# Patient Record
Sex: Female | Born: 1975 | Race: Black or African American | Hispanic: No | Marital: Single | Smoking: Never smoker
Health system: Southern US, Community
[De-identification: ages and names within clinical notes are randomized; demographics above are authoritative.]

## PROBLEM LIST (undated history)

## (undated) DIAGNOSIS — R87619 Unspecified abnormal cytological findings in specimens from cervix uteri: Secondary | ICD-10-CM

## (undated) DIAGNOSIS — IMO0002 Reserved for concepts with insufficient information to code with codable children: Secondary | ICD-10-CM

## (undated) DIAGNOSIS — A609 Anogenital herpesviral infection, unspecified: Secondary | ICD-10-CM

## (undated) DIAGNOSIS — O09529 Supervision of elderly multigravida, unspecified trimester: Secondary | ICD-10-CM

## (undated) HISTORY — DX: Supervision of elderly multigravida, unspecified trimester: O09.529

## (undated) HISTORY — DX: Reserved for concepts with insufficient information to code with codable children: IMO0002

## (undated) HISTORY — PX: NO PAST SURGERIES: SHX2092

## (undated) HISTORY — DX: Unspecified abnormal cytological findings in specimens from cervix uteri: R87.619

## (undated) HISTORY — DX: Anogenital herpesviral infection, unspecified: A60.9

---

## 2006-09-19 ENCOUNTER — Inpatient Hospital Stay (HOSPITAL_COMMUNITY): Admission: AD | Admit: 2006-09-19 | Discharge: 2006-09-21 | Payer: Self-pay | Admitting: Obstetrics and Gynecology

## 2007-12-15 ENCOUNTER — Other Ambulatory Visit: Admission: RE | Admit: 2007-12-15 | Discharge: 2007-12-15 | Payer: Self-pay | Admitting: Obstetrics and Gynecology

## 2009-01-03 ENCOUNTER — Other Ambulatory Visit: Admission: RE | Admit: 2009-01-03 | Discharge: 2009-01-03 | Payer: Self-pay | Admitting: Obstetrics and Gynecology

## 2010-02-27 ENCOUNTER — Other Ambulatory Visit: Admission: RE | Admit: 2010-02-27 | Discharge: 2010-02-27 | Payer: Self-pay | Admitting: Obstetrics and Gynecology

## 2010-03-10 ENCOUNTER — Ambulatory Visit (HOSPITAL_COMMUNITY): Admission: RE | Admit: 2010-03-10 | Discharge: 2010-03-10 | Payer: Self-pay | Admitting: Obstetrics & Gynecology

## 2011-01-26 NOTE — H&P (Signed)
NAME:  Jacqueline Mcdaniel, Jacqueline Mcdaniel               ACCOUNT NO.:  [id number]   MEDICAL RECORD NO.:  [id number]          PATIENT TYPE:  INP   LOCATION:  LDR5                          FACILITY:  [facility]   PHYSICIAN:  Lazaro Arms, M.D.   DATE OF BIRTH:  1976-08-31   DATE OF ADMISSION:  09/19/2006  DATE OF DISCHARGE:  LH                              HISTORY & PHYSICAL   Jacqueline Mcdaniel is a 35 year old, African-American female, gravida 3, para 1,  abortus 1, estimated date of delivery of September 11, 2006, at 59 weeks'  gestation who is admitted for cervical ripening induction of labor.  Her  pregnancy has otherwise been uncomplicated.  Her cervix on September 13, 2006, was 2, thick, -2 vertex, and posterior.   PAST MEDICAL HISTORY:  Negative.   PAST SURGERIES:  Negative.   ALLERGIES:  None.   MEDICATIONS:  1. Valtrex for suppression.  2. Prenatal vitamins.  3. Iron.   Blood type is missing from her chart.  Her  hepatitis B is negative.  HIV is nonreactive.  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  GC and  Chlamydia negative x2.  Group-B strep was negative.  Glucola was normal.   REVIEW OF SYSTEMS:  Negative.   IMPRESSION:  1. Intrauterine pregnancy 41 weeks' gestation.  2. Unfavorable cervix.   PLAN:  The patient admitted for Foley bulb ripening followed by pitocin  induction of labor.  She understands the indications and we will  proceed.      Lazaro Arms, M.D.  Electronically Signed     LHE/MEDQ  D:  09/20/2006  T:  09/20/2006  Job:  [id number]

## 2011-01-26 NOTE — Group Therapy Note (Signed)
Jacqueline Mcdaniel, LANZO               ACCOUNT NO.:  [id number]   MEDICAL RECORD NO.:  [id number]          PATIENT TYPE:  INP   LOCATION:  A410                          FACILITY:  [facility]   PHYSICIAN:  Lazaro Arms, M.D.   DATE OF BIRTH:  12-Jul-1976   DATE OF PROCEDURE:  DATE OF DISCHARGE:                                 PROGRESS NOTE   DELIVERY NOTE.   After [name] received her epidural she developed some hypotension for  which the baby responded by having about 10 minutes of bradycardic  issues during contractions.  She responded very nicely to ephedrine and  the heart rate remained stable throughout.  She was examined at  approximately 1245 and noted to be fully dilated at +2 station; however,  she did not have an urge to push at that time.  At approximately 1305  she did develop an urge to push and so she started pushing and at 1322  had a spontaneous vaginal delivery of a viable female infant.  Weight is  7 pounds 4 ounces, Apgars are 9 and 9.  Twenty units of Pitocin dilated  in 1000 mL of lactated Ringer's was infused rapidly IV.  The placenta  separated spontaneously and delivered via controlled cord traction and  __________ 1327.  It was inspected and appears to be intact with a 3-  vessel cord.  Estimated blood loss was 250 mL.  The vagina was inspected  and no lacerations were found.  The epidural catheter was then removed  with the blue tip visualized as being intact.      [name], C.N.M.      Lazaro Arms, M.D.  Electronically Signed    FC/MEDQ  D:  09/20/2006  T:  09/20/2006  Job:  [id number]

## 2011-01-26 NOTE — Op Note (Signed)
Jacqueline Mcdaniel, Jacqueline Mcdaniel               ACCOUNT NO.:  [id number]   MEDICAL RECORD NO.:  [id number]          PATIENT TYPE:  INP   LOCATION:  LDR5                          FACILITY:  [facility]   PHYSICIAN:  Lazaro Arms, M.D.   DATE OF BIRTH:  02-Dec-1975   DATE OF PROCEDURE:  09/20/2006  DATE OF DISCHARGE:                               OPERATIVE REPORT   OPERATION/PROCEDURE:  Epidural placement.   TIME:  11:40 a.m.   INDICATIONS:  Jacqueline Mcdaniel is a 35 year old, gravida 3, para 1, abortus 1,  5  cm dilated, requesting an epidural to be  placed.   DESCRIPTION OF PROCEDURE:  She was placed in the sitting position,  Betadine prep was used and 1% lidocaine injected in the L3-4 interspace.  The area was still draped.  A 17-gauge Tuohy needle was used.  Loss-of-  resistance technique employed.  Required several sticks because of the  patient's positioning and obesity.  She loss-of-resistance employed and  found the epidural space and 10 mL 0.125% bupivacaine was given as a  test dose without ill effects.  The epidural catheter was then fed,  taken down 5 cm from the epidural space.  An additional 10 mL was given  through the catheter to dose up the epidural.  Continuous infusion  0.125% bupivacaine 2 mcg/mL of fentanyl __________ at 12 ml/hour.  The  patient tolerated it well and was getting good pain relief.  Blood  pressure was stable.  Fetal heart rate tracing stable.      Lazaro Arms, M.D.  Electronically Signed     LHE/MEDQ  D:  09/20/2006  T:  09/20/2006  Job:  [id number]

## 2011-10-25 ENCOUNTER — Other Ambulatory Visit: Payer: Self-pay | Admitting: Obstetrics & Gynecology

## 2012-02-16 IMAGING — US US SOFT TISSUE HEAD/NECK
1 series · 14 of 21 positions shown · non-contrast
Comparison: None.

CLINICAL DATA: Enlarged thyroid gland

THYROID ULTRASOUND
TECHNIQUE: Ultrasound examination of the thyroid gland and
adjacent soft tissues was performed.

[Series 1: us soft tissue head/neck · 0.08mm/px · 14 of 21 slices shown]
[im 1/21]
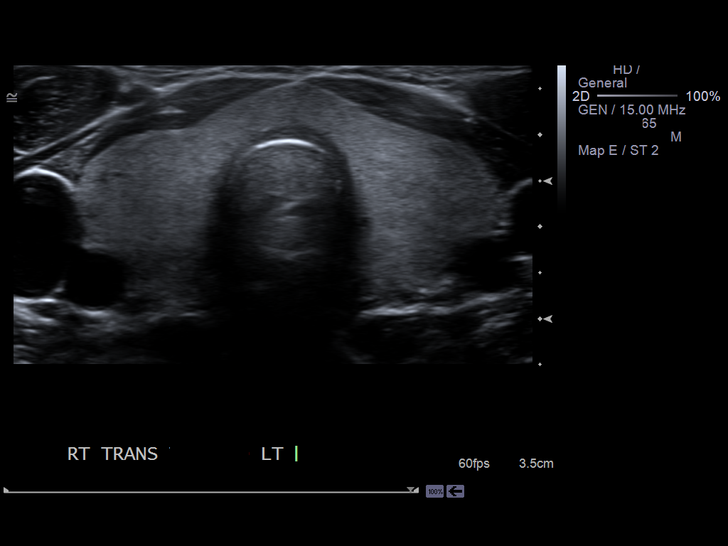
[im 3/21]
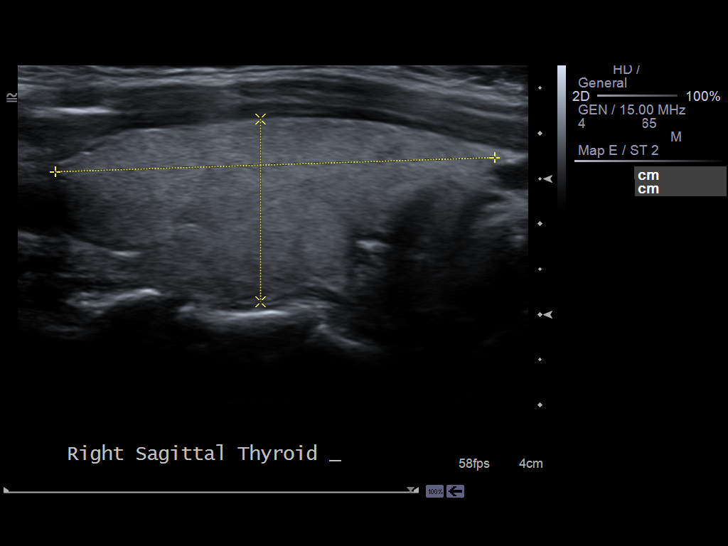
[im 4/21]
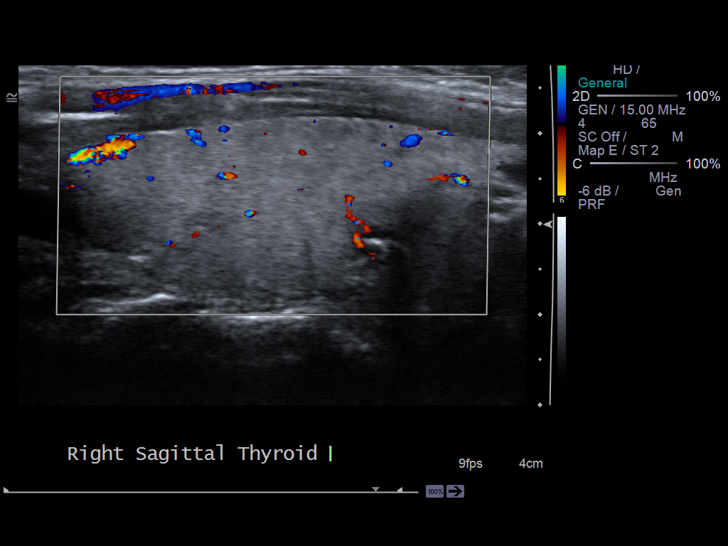
[im 6/21]
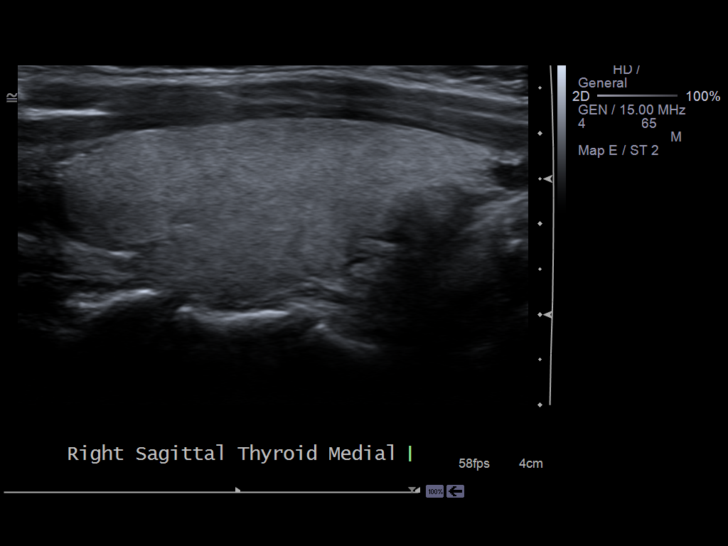
[im 7/21]
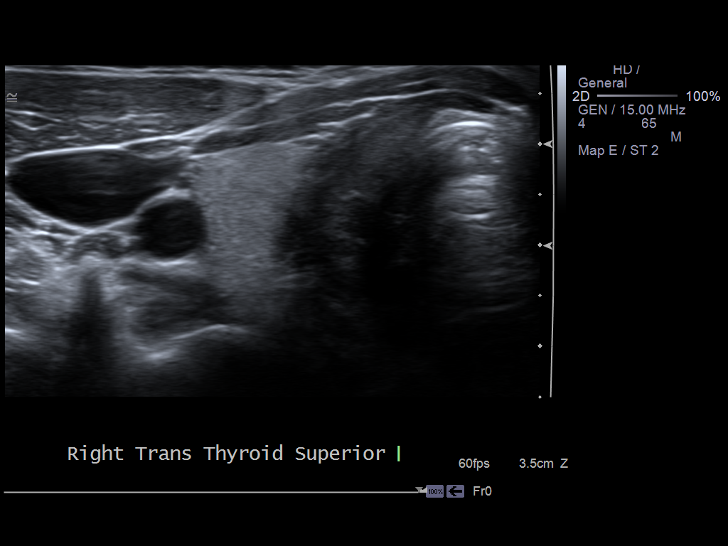
[im 9/21]
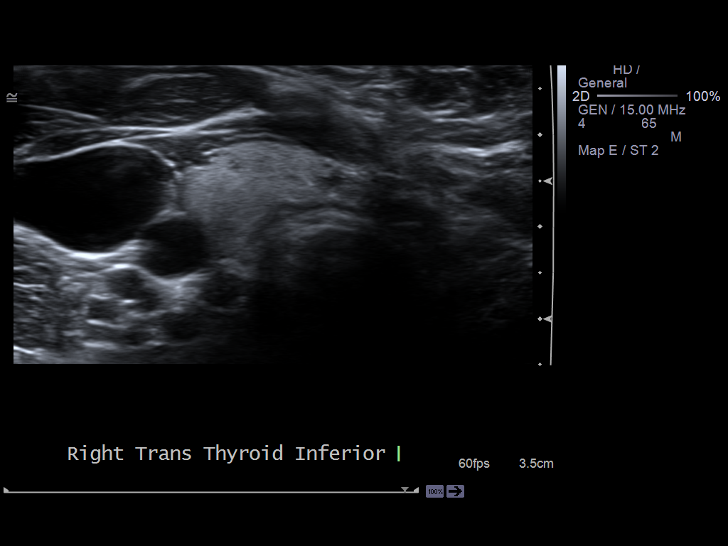
[im 10/21]
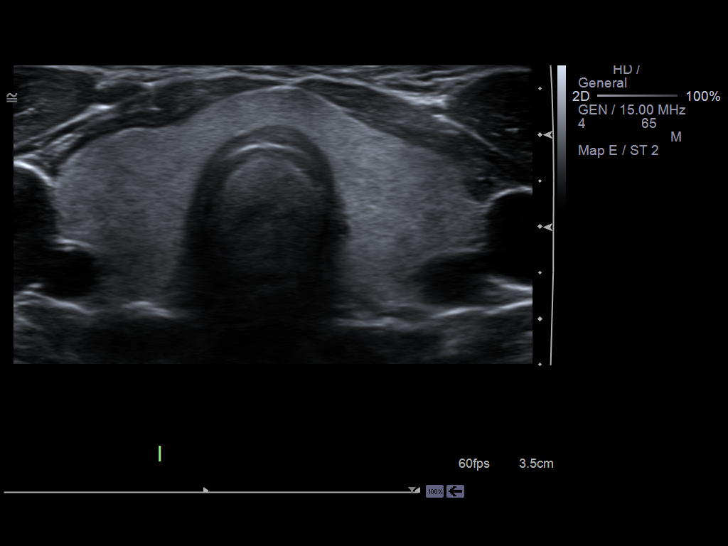
[im 12/21]
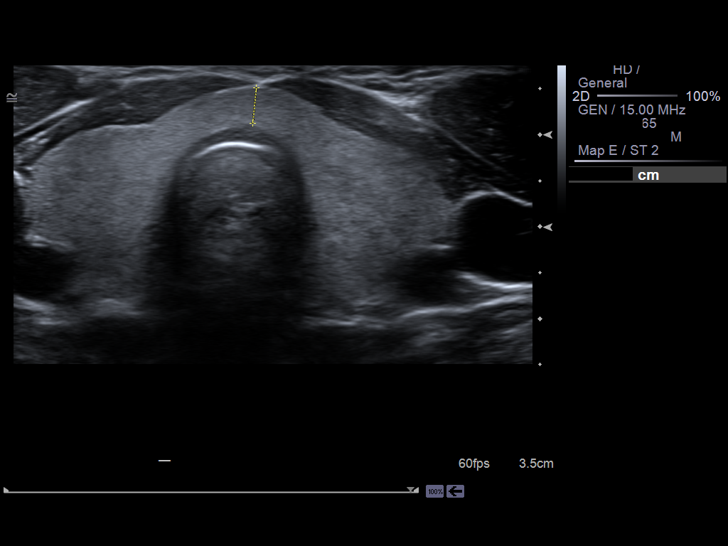
[im 13/21]
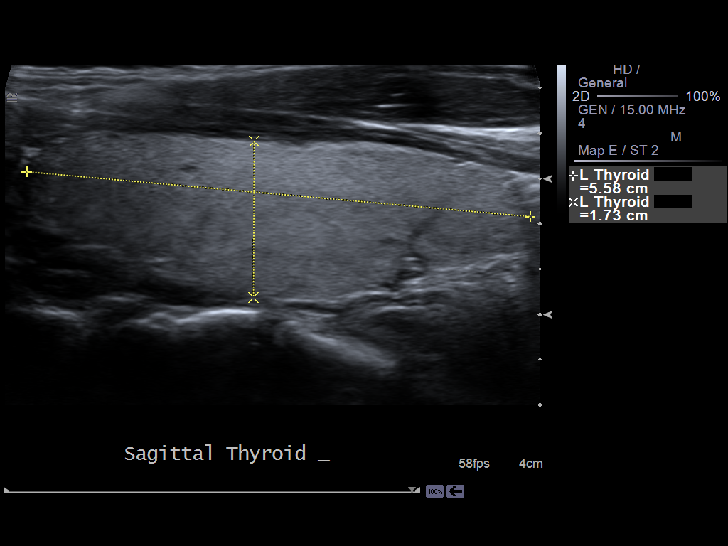
[im 15/21]
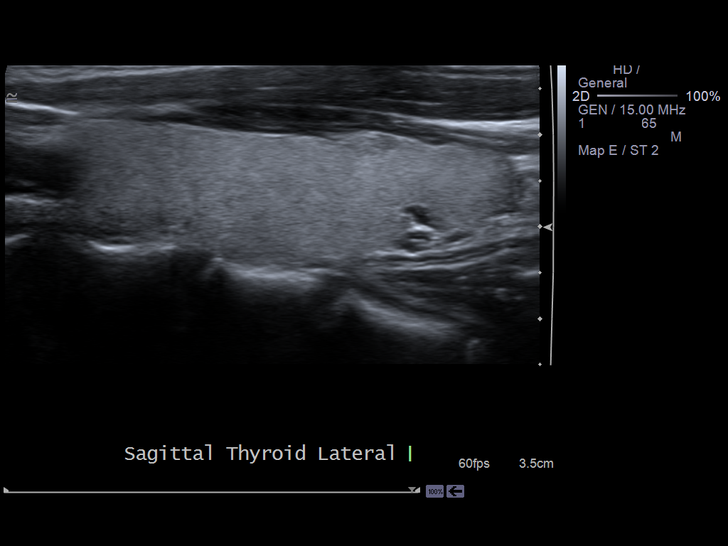
[im 16/21]
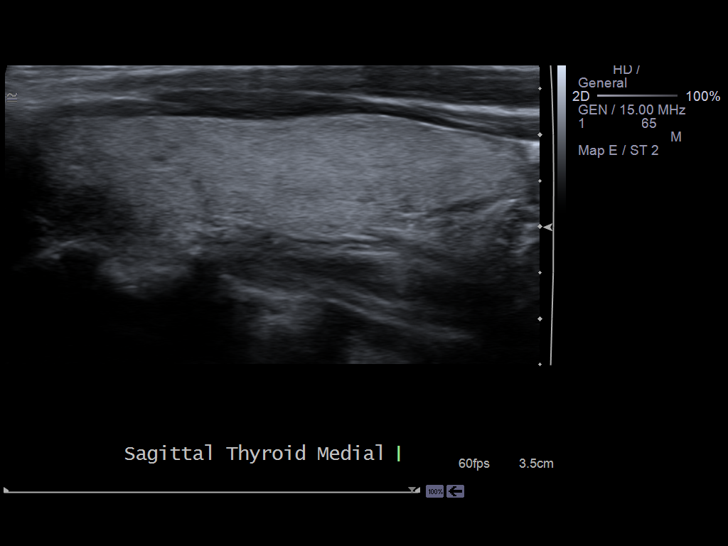
[im 18/21]
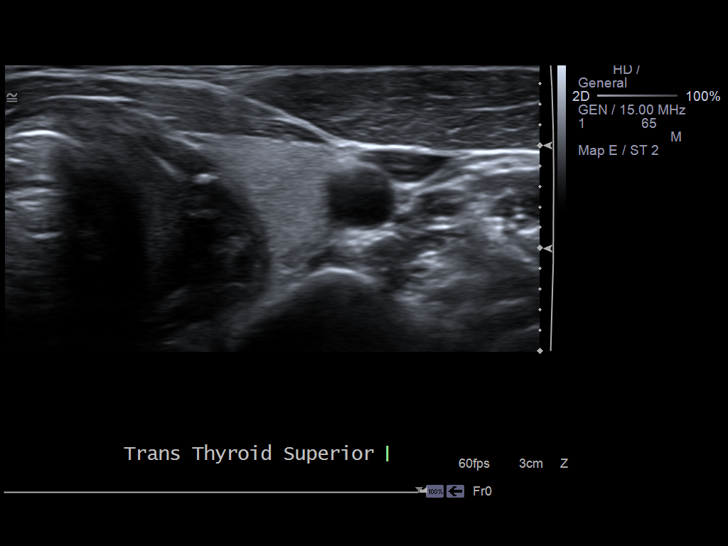
[im 19/21]
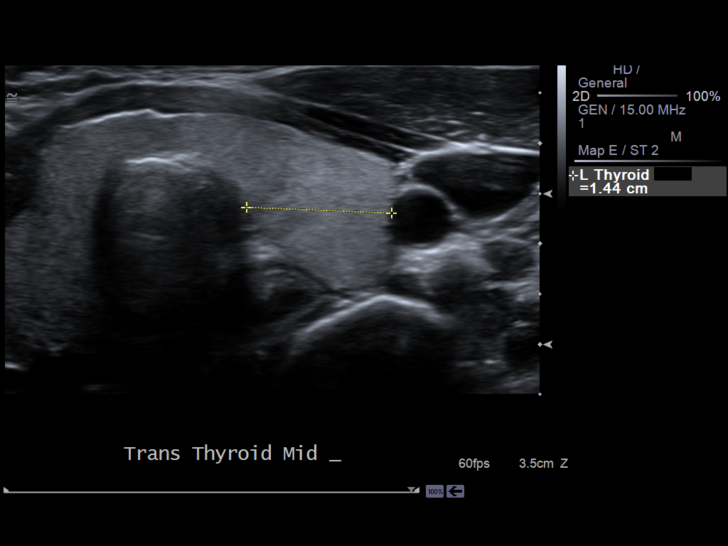
[im 21/21]
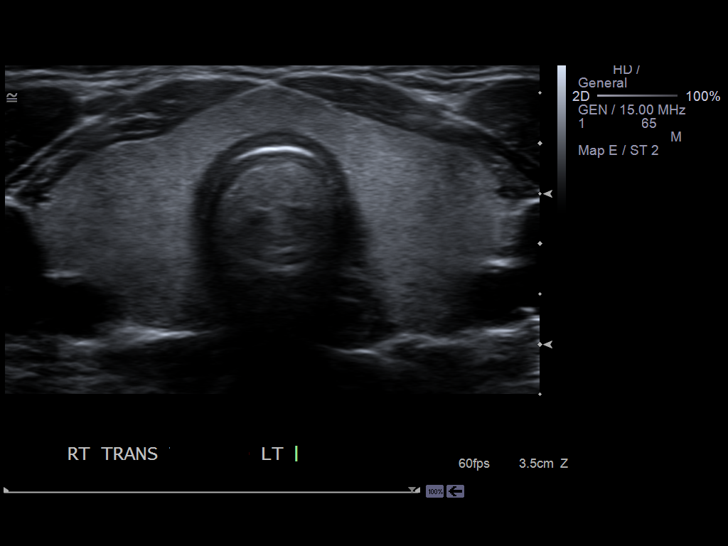

[14 of 21 positions shown; findings below may reference images not displayed]

FINDINGS: The thyroid is within  normal limits in size. The right
lobe measures 4.9 x 2.0 x 1.3 cm, and the left lower lobe measures
5.6 x 1.7 x 1.4 cm.  The isthmus measures 4 mm in width.  The
parenchyma is homogeneous.  No discrete thyroid nodules or masses
are identified.  No other significant sonographic abnormalities are
seen in the adjacent soft tissues.
IMPRESSION: Normal study.

## 2012-03-05 ENCOUNTER — Other Ambulatory Visit: Payer: Self-pay | Admitting: Adult Health

## 2012-03-05 ENCOUNTER — Other Ambulatory Visit (HOSPITAL_COMMUNITY)
Admission: RE | Admit: 2012-03-05 | Discharge: 2012-03-05 | Disposition: A | Discharge status: Home / Self Care | Payer: BC Managed Care – PPO | Source: Ambulatory Visit | Attending: Obstetrics and Gynecology | Admitting: Obstetrics and Gynecology

## 2012-03-05 DIAGNOSIS — Z01419 Encounter for gynecological examination (general) (routine) without abnormal findings: Secondary | ICD-10-CM | POA: Insufficient documentation

## 2012-03-05 DIAGNOSIS — R87810 Cervical high risk human papillomavirus (HPV) DNA test positive: Secondary | ICD-10-CM | POA: Insufficient documentation

## 2012-03-05 LAB — OB RESULTS CONSOLE ANTIBODY SCREEN: Antibody Screen: NEGATIVE

## 2012-03-05 LAB — OB RESULTS CONSOLE RPR: RPR: NONREACTIVE

## 2012-03-05 LAB — OB RESULTS CONSOLE RUBELLA ANTIBODY, IGM: Rubella: IMMUNE

## 2012-03-05 LAB — OB RESULTS CONSOLE HIV ANTIBODY (ROUTINE TESTING): HIV: NONREACTIVE

## 2012-03-05 LAB — OB RESULTS CONSOLE HEPATITIS B SURFACE ANTIGEN: Hepatitis B Surface Ag: NEGATIVE

## 2012-03-05 LAB — OB RESULTS CONSOLE ABO/RH: RH Type: POSITIVE

## 2012-03-05 LAB — OB RESULTS CONSOLE GC/CHLAMYDIA
Chlamydia: NEGATIVE
Gonorrhea: NEGATIVE

## 2012-09-10 NOTE — L&D Delivery Note (Signed)
Delivery Note At 7:33 PM a viable female was delivered via vacuum assisted vaginal delivery (Presentation:LOP  ) w/ reduction of loose nuchal cord x 1 by dr. Marice Potter.  APGAR: 9/9 ; weight:pending .   Placenta status: Delivered Intact, Spontaneous.  Cord: 3vc, with the following complications: none .  Cord pH: not done  Anesthesia: Epidural  Episiotomy: n/a Lacerations: hemostatic perineal 'skid mark'- no repair Suture Repair: n/a Est. Blood Loss (mL): 450  IV infiltrated w/ pitocin bolus, so was d/c'd by RN and 10units pitocin IM given Fundus boggy w/ increased oozing, so cytotec pr given w/ quick resolution of bleeding and firmed uterus  Mom to postpartum.  Baby to nursery-stable. Breast/bottlefeeding.  Wants IP circumcision. Desires OCPs for contraception.  Marge Duncans 10/25/2012, 7:58 PM

## 2012-09-26 LAB — OB RESULTS CONSOLE GBS: GBS: NEGATIVE

## 2012-10-21 ENCOUNTER — Encounter (HOSPITAL_COMMUNITY): Payer: Self-pay | Admitting: *Deleted

## 2012-10-21 ENCOUNTER — Telehealth (HOSPITAL_COMMUNITY): Payer: Self-pay | Admitting: *Deleted

## 2012-10-21 NOTE — Telephone Encounter (Signed)
Preadmission screen  

## 2012-10-24 ENCOUNTER — Inpatient Hospital Stay (HOSPITAL_COMMUNITY)
Admission: RE | Admit: 2012-10-24 | Discharge: 2012-10-27 | DRG: 373 | Disposition: A | Discharge status: Home / Self Care | Payer: BC Managed Care – PPO | Source: Ambulatory Visit | Attending: Obstetrics & Gynecology | Admitting: Obstetrics & Gynecology

## 2012-10-24 ENCOUNTER — Encounter (HOSPITAL_COMMUNITY): Payer: Self-pay

## 2012-10-24 DIAGNOSIS — O480 Post-term pregnancy: Principal | ICD-10-CM | POA: Diagnosis present

## 2012-10-24 DIAGNOSIS — O09529 Supervision of elderly multigravida, unspecified trimester: Secondary | ICD-10-CM | POA: Diagnosis present

## 2012-10-24 LAB — CBC
HCT: 37.7 % (ref 36.0–46.0)
MCV: 88.7 fL (ref 78.0–100.0)
RBC: 4.25 MIL/uL (ref 3.87–5.11)
WBC: 9.4 10*3/uL (ref 4.0–10.5)

## 2012-10-24 MED ORDER — VALACYCLOVIR HCL 500 MG PO TABS
500.0000 mg | ORAL_TABLET | Freq: Every day | ORAL | Status: DC
  Administered 2012-10-25: 500 mg via ORAL
  Filled 2012-10-24 (×2): qty 1

## 2012-10-24 MED ORDER — OXYTOCIN BOLUS FROM INFUSION: 500.0000 mL | INTRAVENOUS | Status: DC

## 2012-10-24 MED ORDER — IBUPROFEN 600 MG PO TABS: 600.0000 mg | ORAL_TABLET | Freq: Four times a day (QID) | ORAL | Status: DC | PRN

## 2012-10-24 MED ORDER — GI COCKTAIL ~~LOC~~
30.0000 mL | Freq: Once | ORAL | Status: AC
  Administered 2012-10-24: 30 mL via ORAL
  Filled 2012-10-24: qty 30

## 2012-10-24 MED ORDER — LACTATED RINGERS IV SOLN
500.0000 mL | INTRAVENOUS | Status: DC | PRN
  Administered 2012-10-25: 500 mL via INTRAVENOUS

## 2012-10-24 MED ORDER — OXYTOCIN 40 UNITS IN LACTATED RINGERS INFUSION - SIMPLE MED
62.5000 mL/h | INTRAVENOUS | Status: DC
  Filled 2012-10-24: qty 1000

## 2012-10-24 MED ORDER — CITRIC ACID-SODIUM CITRATE 334-500 MG/5ML PO SOLN
30.0000 mL | ORAL | Status: DC | PRN
  Administered 2012-10-25 (×2): 30 mL via ORAL
  Filled 2012-10-24 (×3): qty 15

## 2012-10-24 MED ORDER — OXYCODONE-ACETAMINOPHEN 5-325 MG PO TABS: 1.0000 | ORAL_TABLET | ORAL | Status: DC | PRN

## 2012-10-24 MED ORDER — ZOLPIDEM TARTRATE 5 MG PO TABS
5.0000 mg | ORAL_TABLET | Freq: Every evening | ORAL | Status: DC | PRN
  Administered 2012-10-25: 5 mg via ORAL
  Filled 2012-10-24: qty 1

## 2012-10-24 MED ORDER — LACTATED RINGERS IV SOLN
INTRAVENOUS | Status: DC
  Administered 2012-10-24 – 2012-10-25 (×4): via INTRAVENOUS

## 2012-10-24 MED ORDER — TERBUTALINE SULFATE 1 MG/ML IJ SOLN: 0.2500 mg | Freq: Once | INTRAMUSCULAR | Status: AC | PRN

## 2012-10-24 MED ORDER — LIDOCAINE HCL (PF) 1 % IJ SOLN
30.0000 mL | INTRAMUSCULAR | Status: DC | PRN
  Filled 2012-10-24: qty 30

## 2012-10-24 MED ORDER — MISOPROSTOL 25 MCG QUARTER TABLET
25.0000 ug | ORAL_TABLET | ORAL | Status: DC | PRN
  Administered 2012-10-24: 25 ug via VAGINAL
  Filled 2012-10-24 (×2): qty 0.25

## 2012-10-24 MED ORDER — FAMOTIDINE 20 MG PO TABS
40.0000 mg | ORAL_TABLET | Freq: Every day | ORAL | Status: DC
  Administered 2012-10-25: 40 mg via ORAL
  Filled 2012-10-24: qty 2

## 2012-10-24 MED ORDER — ONDANSETRON HCL 4 MG/2ML IJ SOLN: 4.0000 mg | Freq: Four times a day (QID) | INTRAMUSCULAR | Status: DC | PRN

## 2012-10-24 MED ORDER — ACETAMINOPHEN 325 MG PO TABS: 650.0000 mg | ORAL_TABLET | ORAL | Status: DC | PRN

## 2012-10-24 NOTE — H&P (Signed)
Jacqueline Mcdaniel is a 37 y.o. [id number] at 41wga who presents for induction of labor for post-dates.  Patient states that she has been feeling off and on pressure in the last few days, but not regular and mild in nature. Denies any loss of fluid, any vaginal bleeding, any abnormal discharge. Feeling good fetal movement.   She receives care at [facility]. She plans on breastfeeding and OCP for birth control.  No complications during pregnancy. 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  History OB History   Grav Para Term Preterm Abortions TAB SAB Ect Mult Living   5 2 2  2 1 1   2      Past Medical History  Diagnosis Date  . HSV (herpes simplex virus) anogenital infection   . AMA (advanced maternal age) multigravida 35+   . Abnormal Pap smear    Past Surgical History  Procedure Laterality Date  . No past surgeries     Family History: family history includes Diabetes in her father; Heart attack in her father; and Hypertension in her father and mother. Social History:  reports that she has never smoked. She has never used smokeless tobacco. She reports that she does not drink alcohol or use illicit drugs.   Prenatal Transfer Tool  Maternal Diabetes: No Genetic Screening: Normal Maternal Ultrasounds/Referrals: Normal Fetal Ultrasounds or other Referrals:  None Maternal Substance Abuse:  No Significant Maternal Medications:  Meds include: Other: valtrex 1g qd Significant Maternal Lab Results:  Lab values include: Group B Strep negative, Other: HSV2 pos Other Comments:  None  ROS Negative except per HPI Dilation: 1 Effacement (%): 50 Station: -3 Exam by:: Dr. Gwenlyn Saran Blood pressure 114/53, pulse 114, temperature 98 F (36.7 C), temperature source Oral, resp. rate 20, height 5\' 2"  (1.575 m), weight 238 lb (107.956 kg). Exam Physical Exam  Physical Examination: General appearance - alert, well appearing, and in no distress Mental status - alert, oriented to person, place, and  time Chest - normal work of breathing Heart - regular rate and rythm Abdomen - gravid, non tender.  Pelvic - normal external genitalia, vulva, no active lesions noted Extremities - no pedal edema noted  FHT: 150, moderate variability, present accels, no decels.  Cntx: irregular every 3-60min Prenatal labs: ABO, Rh: O/Positive/-- (06/26 0000) Antibody: Negative (06/26 0000) Rubella: Immune (06/26 0000) RPR: Nonreactive (06/26 0000)  HBsAg: Negative (06/26 0000)  HIV: Non-reactive (06/26 0000)  GBS: Negative (01/17 0000)   Assessment/Plan: 37 y.o. [id number] at 41wga who presents for induction of labor for post-dates.  - induction: cytotec since too posterior for foley bulb placement. Contractions present on monitor but not felt by patient. Recheck in 4 hours.  - fetal well being: category 1  - GERD: GI cocktail given.  - GBS negative - expected mode of delivery: NSVD  LOSQ, STEPHANIE 10/24/2012, 11:35 PM  I have seen and examined this patient and I agree with the above. Cam Hai 8:40 AM 10/25/2012

## 2012-10-25 ENCOUNTER — Encounter (HOSPITAL_COMMUNITY): Payer: Self-pay

## 2012-10-25 ENCOUNTER — Encounter (HOSPITAL_COMMUNITY): Payer: Self-pay | Admitting: Anesthesiology

## 2012-10-25 ENCOUNTER — Inpatient Hospital Stay (HOSPITAL_COMMUNITY): Payer: BC Managed Care – PPO | Admitting: Anesthesiology

## 2012-10-25 DIAGNOSIS — O480 Post-term pregnancy: Secondary | ICD-10-CM

## 2012-10-25 DIAGNOSIS — O09529 Supervision of elderly multigravida, unspecified trimester: Secondary | ICD-10-CM

## 2012-10-25 LAB — TYPE AND SCREEN
ABO/RH(D): O POS
Antibody Screen: NEGATIVE

## 2012-10-25 MED ORDER — OXYTOCIN 40 UNITS IN LACTATED RINGERS INFUSION - SIMPLE MED: 62.5000 mL/h | INTRAVENOUS | Status: DC | PRN

## 2012-10-25 MED ORDER — SODIUM CHLORIDE 0.9 % IV SOLN: 250.0000 mL | INTRAVENOUS | Status: DC | PRN

## 2012-10-25 MED ORDER — WITCH HAZEL-GLYCERIN EX PADS: 1.0000 "application " | MEDICATED_PAD | CUTANEOUS | Status: DC | PRN

## 2012-10-25 MED ORDER — TETANUS-DIPHTH-ACELL PERTUSSIS 5-2.5-18.5 LF-MCG/0.5 IM SUSP: 0.5000 mL | Freq: Once | INTRAMUSCULAR | Status: DC

## 2012-10-25 MED ORDER — FLEET ENEMA 7-19 GM/118ML RE ENEM: 1.0000 | ENEMA | Freq: Every day | RECTAL | Status: DC | PRN

## 2012-10-25 MED ORDER — FENTANYL CITRATE 0.05 MG/ML IJ SOLN
100.0000 ug | INTRAMUSCULAR | Status: DC | PRN
  Administered 2012-10-25: 100 ug via INTRAVENOUS
  Filled 2012-10-25 (×2): qty 2

## 2012-10-25 MED ORDER — SENNOSIDES-DOCUSATE SODIUM 8.6-50 MG PO TABS
2.0000 | ORAL_TABLET | Freq: Every day | ORAL | Status: DC
  Administered 2012-10-25 – 2012-10-26 (×2): 2 via ORAL

## 2012-10-25 MED ORDER — BENZOCAINE-MENTHOL 20-0.5 % EX AERO
1.0000 "application " | INHALATION_SPRAY | CUTANEOUS | Status: DC | PRN
  Administered 2012-10-26: 1 via TOPICAL
  Filled 2012-10-25: qty 56

## 2012-10-25 MED ORDER — EPHEDRINE 5 MG/ML INJ
10.0000 mg | INTRAVENOUS | Status: DC | PRN
  Filled 2012-10-25: qty 4

## 2012-10-25 MED ORDER — LIDOCAINE HCL (PF) 1 % IJ SOLN
INTRAMUSCULAR | Status: DC | PRN
  Administered 2012-10-25 (×2): 5 mL

## 2012-10-25 MED ORDER — LACTATED RINGERS IV SOLN
INTRAVENOUS | Status: DC
  Administered 2012-10-25: 15:00:00 via INTRAUTERINE

## 2012-10-25 MED ORDER — OXYTOCIN 10 UNIT/ML IJ SOLN
INTRAMUSCULAR | Status: AC
  Administered 2012-10-25: 10 [IU]
  Filled 2012-10-25: qty 2

## 2012-10-25 MED ORDER — DIBUCAINE 1 % RE OINT: 1.0000 "application " | TOPICAL_OINTMENT | RECTAL | Status: DC | PRN

## 2012-10-25 MED ORDER — SIMETHICONE 80 MG PO CHEW
80.0000 mg | CHEWABLE_TABLET | ORAL | Status: DC | PRN
  Administered 2012-10-26: 80 mg via ORAL

## 2012-10-25 MED ORDER — ZOLPIDEM TARTRATE 5 MG PO TABS: 5.0000 mg | ORAL_TABLET | Freq: Every evening | ORAL | Status: DC | PRN

## 2012-10-25 MED ORDER — MEASLES, MUMPS & RUBELLA VAC ~~LOC~~ INJ
0.5000 mL | INJECTION | Freq: Once | SUBCUTANEOUS | Status: DC
  Filled 2012-10-25: qty 0.5

## 2012-10-25 MED ORDER — IBUPROFEN 600 MG PO TABS
600.0000 mg | ORAL_TABLET | Freq: Four times a day (QID) | ORAL | Status: DC
  Administered 2012-10-25 – 2012-10-27 (×6): 600 mg via ORAL
  Filled 2012-10-25 (×7): qty 1

## 2012-10-25 MED ORDER — DIPHENHYDRAMINE HCL 50 MG/ML IJ SOLN: 12.5000 mg | INTRAMUSCULAR | Status: DC | PRN

## 2012-10-25 MED ORDER — FENTANYL 2.5 MCG/ML BUPIVACAINE 1/10 % EPIDURAL INFUSION (WH - ANES)
14.0000 mL/h | INTRAMUSCULAR | Status: DC
  Administered 2012-10-25: 14 mL/h via EPIDURAL
  Filled 2012-10-25: qty 125

## 2012-10-25 MED ORDER — SODIUM CHLORIDE 0.9 % IJ SOLN: 3.0000 mL | Freq: Two times a day (BID) | INTRAMUSCULAR | Status: DC

## 2012-10-25 MED ORDER — PHENYLEPHRINE 40 MCG/ML (10ML) SYRINGE FOR IV PUSH (FOR BLOOD PRESSURE SUPPORT)
80.0000 ug | PREFILLED_SYRINGE | INTRAVENOUS | Status: DC | PRN
  Filled 2012-10-25: qty 5

## 2012-10-25 MED ORDER — SODIUM CHLORIDE 0.9 % IJ SOLN: 3.0000 mL | INTRAMUSCULAR | Status: DC | PRN

## 2012-10-25 MED ORDER — PRENATAL MULTIVITAMIN CH
1.0000 | ORAL_TABLET | Freq: Every day | ORAL | Status: DC
Start: 2012-10-25 — End: 2012-10-27
  Administered 2012-10-26 – 2012-10-27 (×2): 1 via ORAL
  Filled 2012-10-25 (×2): qty 1

## 2012-10-25 MED ORDER — PHENYLEPHRINE 40 MCG/ML (10ML) SYRINGE FOR IV PUSH (FOR BLOOD PRESSURE SUPPORT): 80.0000 ug | PREFILLED_SYRINGE | INTRAVENOUS | Status: DC | PRN

## 2012-10-25 MED ORDER — MISOPROSTOL 200 MCG PO TABS
ORAL_TABLET | ORAL | Status: AC
  Administered 2012-10-25: 800 ug
  Filled 2012-10-25: qty 4

## 2012-10-25 MED ORDER — DIPHENHYDRAMINE HCL 25 MG PO CAPS: 25.0000 mg | ORAL_CAPSULE | Freq: Four times a day (QID) | ORAL | Status: DC | PRN

## 2012-10-25 MED ORDER — LACTATED RINGERS IV SOLN
500.0000 mL | Freq: Once | INTRAVENOUS | Status: AC
  Administered 2012-10-25: 1000 mL via INTRAVENOUS

## 2012-10-25 MED ORDER — ONDANSETRON HCL 4 MG/2ML IJ SOLN: 4.0000 mg | INTRAMUSCULAR | Status: DC | PRN

## 2012-10-25 MED ORDER — ONDANSETRON HCL 4 MG PO TABS: 4.0000 mg | ORAL_TABLET | ORAL | Status: DC | PRN

## 2012-10-25 MED ORDER — LANOLIN HYDROUS EX OINT: TOPICAL_OINTMENT | CUTANEOUS | Status: DC | PRN

## 2012-10-25 MED ORDER — OXYCODONE-ACETAMINOPHEN 5-325 MG PO TABS
1.0000 | ORAL_TABLET | ORAL | Status: DC | PRN
  Administered 2012-10-25: 1 via ORAL
  Administered 2012-10-26 (×3): 2 via ORAL
  Administered 2012-10-27 (×2): 1 via ORAL
  Filled 2012-10-25: qty 2
  Filled 2012-10-25: qty 1
  Filled 2012-10-25: qty 2
  Filled 2012-10-25 (×2): qty 1
  Filled 2012-10-25: qty 2

## 2012-10-25 MED ORDER — BISACODYL 10 MG RE SUPP: 10.0000 mg | Freq: Every day | RECTAL | Status: DC | PRN

## 2012-10-25 MED ORDER — EPHEDRINE 5 MG/ML INJ: 10.0000 mg | INTRAVENOUS | Status: DC | PRN

## 2012-10-25 NOTE — Anesthesia Preprocedure Evaluation (Signed)
Anesthesia Evaluation  Patient identified by MRN, date of birth, ID band Patient awake    Reviewed: Allergy & Precautions, H&P , Patient's Chart, lab work & pertinent test results  Airway Mallampati: III TM Distance: >3 FB Neck ROM: full    Dental no notable dental hx.    Pulmonary neg pulmonary ROS,  breath sounds clear to auscultation  Pulmonary exam normal       Cardiovascular negative cardio ROS  Rhythm:regular Rate:Normal     Neuro/Psych negative neurological ROS  negative psych ROS   GI/Hepatic negative GI ROS, Neg liver ROS,   Endo/Other  negative endocrine ROSMorbid obesity  Renal/GU negative Renal ROS     Musculoskeletal   Abdominal   Peds  Hematology negative hematology ROS (+)   Anesthesia Other Findings HSV (herpes simplex virus) anogenital infection     AMA (advanced maternal age) multigravida 35+        Abnormal Pap smear          Reproductive/Obstetrics (+) Pregnancy                           Anesthesia Physical Anesthesia Plan  ASA: III  Anesthesia Plan: Epidural   Post-op Pain Management:    Induction:   Airway Management Planned:   Additional Equipment:   Intra-op Plan:   Post-operative Plan:   Informed Consent: I have reviewed the patients History and Physical, chart, labs and discussed the procedure including the risks, benefits and alternatives for the proposed anesthesia with the patient or authorized representative who has indicated his/her understanding and acceptance.     Plan Discussed with:   Anesthesia Plan Comments:         Anesthesia Quick Evaluation

## 2012-10-25 NOTE — Progress Notes (Signed)
S. Comfortable with her epidural. O. VSS, AF  FHR-140s with deep variables lasting up to a minute Per Joellyn Haff, CNMW her cervix is 7/90/0  A/P. Term, labor progressing slowly. I have offered her a c/s due to the recurrent variable deceleration. She understands that this is my recommendation, but she refuses this currently.  Expectant management.

## 2012-10-25 NOTE — Progress Notes (Addendum)
SHAWNAY BRAMEL is a 37 y.o. [id number] at [redacted]w[redacted]d admitted for induction of labor for post dates  Subjective: Contracting every 3-4 minutes, more uncomfortable  Objective: BP 105/63  Pulse 85  Temp(Src) 98.2 F (36.8 C) (Oral)  Resp 20  Ht 5\' 2"  (1.575 m)  Wt 238 lb (107.956 kg)  BMI 43.52 kg/m2     FHT:  FHR: 150 bpm, variability: moderate,  accelerations:  Present,  decelerations:  Present late decel x1, variable x1 UC:   regular, every 3 minutes SVE:   Dilation: 1.5 Effacement (%): 50 Station: -2 Exam by:: Dr. Gwenlyn Saran  Labs: Lab Results  Component Value Date   WBC 9.4 10/24/2012   HGB 12.5 10/24/2012   HCT 37.7 10/24/2012   MCV 88.7 10/24/2012   PLT 241 10/24/2012    Assessment / Plan: Induction of labor due to post dates,  progressing well on pitocin  Labor: contracting more frequently. Given presence of late decel and variable, will hold on augmenting further with cytotec and re-check in 1 hour for any further cervical change Fetal Wellbeing:  Category II, late decel resolved with position change.  Pain Control:  Labor support without medications I/D:  GBS neg Anticipated MOD:  NSVD  Raciel Caffrey 10/25/2012, 4:24 AM

## 2012-10-25 NOTE — Progress Notes (Signed)
Jacqueline Mcdaniel is a 37 y.o. [id number] at [redacted]w[redacted]d admitted for induction of labor due to Post dates.  Subjective: Uncomfortable with contractions  Objective: BP 105/63  Pulse 85  Temp(Src) 98.2 F (36.8 C) (Oral)  Resp 20  Ht 5\' 2"  (1.575 m)  Wt 238 lb (107.956 kg)  BMI 43.52 kg/m2      FHT:  FHR: 150 bpm, variability: moderate,  accelerations:  Present,  decelerations:  Present variables UC:   irregular, every 2-4 minutes SVE:   Dilation: 1.5 Effacement (%): 50 Station: -2 Exam by:: Dr. Gwenlyn Saran  Labs: Lab Results  Component Value Date   WBC 9.4 10/24/2012   HGB 12.5 10/24/2012   HCT 37.7 10/24/2012   MCV 88.7 10/24/2012   PLT 241 10/24/2012    Assessment / Plan: 37 y.o. [id number] at [redacted]w[redacted]d admitted for induction of labor due to Post dates.   Labor: s/p cytotec x1. some pogression. consider pitocin if improvement of fetal strip monitor.  Fetal Wellbeing:  Category II, bolus patient. Hold pitocin for now. If doesn't respond, consider amnioinfusion (if able to AROM) Pain Control:  Labor support without medications I/D:  GBS neg Anticipated MOD:  NSVD  Jermy Couper 10/25/2012, 6:14 AM

## 2012-10-25 NOTE — Progress Notes (Addendum)
Jacqueline Mcdaniel is a 37 y.o. [id number] at [redacted]w[redacted]d admitted for induction of labor due to postdates.  Subjective: Mostly comfortable w/ epidural, still able to feel when having uc's, but not as painful as was prior to epidural Family supportive at bs  Objective: BP 124/70  Pulse 90  Temp(Src) 97.7 F (36.5 C) (Oral)  Resp 20  Ht 5\' 2"  (1.575 m)  Wt 107.956 kg (238 lb)  BMI 43.52 kg/m2  SpO2 100%     SROM clear fluid @ 1041  FHT:  FHR: 145 bpm, variability: moderate,  accelerations:  Abscent,  decelerations:  Present repetitive lates  Lates resolving w/ fluid bolus, maternal position change, and O2 UC:   regular, every 2-4 minutes- not tracing well d/t maternal position changes SVE:   6/80/-2, more anterior, soft IUPC placed w/o difficulty  Labs: Lab Results  Component Value Date   WBC 9.4 10/24/2012   HGB 12.5 10/24/2012   HCT 37.7 10/24/2012   MCV 88.7 10/24/2012   PLT 241 10/24/2012    Assessment / Plan: IOL d/t postdates, s/p foley bulb, regular and painful uc's on own  Labor: early phase s/p foley bulb Preeclampsia:  n/a Fetal Wellbeing:  Category II Pain Control:  Epidural I/D:  n/a Anticipated MOD:  NSVD  Marge Duncans 10/25/2012, 1:34 PM

## 2012-10-25 NOTE — Anesthesia Procedure Notes (Signed)
Epidural Patient location during procedure: OB Start time: 10/25/2012 12:55 PM  Staffing Anesthesiologist: Angus Seller., Harrell Gave. Performed by: anesthesiologist   Preanesthetic Checklist Completed: patient identified, site marked, surgical consent, pre-op evaluation, timeout performed, IV checked, risks and benefits discussed and monitors and equipment checked  Epidural Patient position: sitting Prep: site prepped and draped and DuraPrep Patient monitoring: continuous pulse ox and blood pressure Approach: midline Injection technique: LOR air and LOR saline  Needle:  Needle type: Tuohy  Needle gauge: 17 G Needle length: 9 cm and 9 Needle insertion depth: 9 cm Catheter type: closed end flexible Catheter size: 19 Gauge Catheter at skin depth: 15 cm Test dose: negative  Assessment Events: blood not aspirated, injection not painful, no injection resistance, negative IV test and no paresthesia  Additional Notes Patient identified.  Risk benefits discussed including failed block, incomplete pain control, headache, nerve damage, paralysis, blood pressure changes, nausea, vomiting, reactions to medication both toxic or allergic, and postpartum back pain.  Patient expressed understanding and wished to proceed.  All questions were answered.  Sterile technique used throughout procedure and epidural site dressed with sterile barrier dressing. No paresthesia or other complications noted.The patient did not experience any signs of intravascular injection such as tinnitus or metallic taste in mouth nor signs of intrathecal spread such as rapid motor block. Please see nursing notes for vital signs.

## 2012-10-25 NOTE — Progress Notes (Signed)
Jacqueline Mcdaniel is a 37 y.o. [id number] at [redacted]w[redacted]d admitted for IOL d/t postdates  Subjective: Comfortable w/ epidural, no complaints  Objective: BP 123/66  Pulse 69  Temp(Src) 97.7 F (36.5 C) (Oral)  Resp 20  Ht 5\' 2"  (1.575 m)  Wt 107.956 kg (238 lb)  BMI 43.52 kg/m2  SpO2 100%      FHT:  FHR: 145 bpm, variability: mod-marked,  accelerations:  Present,  decelerations:  Present deep repetitive variables and prolonged UC:   irregular, every 1.5-6 minutes, coupling, inadequate mvu's SVE:   Dilation: 6 Effacement (%): 80 Station: -2 Exam by:: Joellyn Haff, CNM @ [phone], deferred at this time  Labs: Lab Results  Component Value Date   WBC 9.4 10/24/2012   HGB 12.5 10/24/2012   HCT 37.7 10/24/2012   MCV 88.7 10/24/2012   PLT 241 10/24/2012    Assessment / Plan: IOL d/t postdates, s/p cervical ripening phase & srom, inadequate mvu's, repetitive variables/prolonged-unable to begin pitocin Begin amnioinfusion bolus, then 176ml/hr No cervical change since 1230, will recheck cervix in 1hr, if no change and no improvement in fhr w/ amnioinfusion, c/s likely- discussed w/ pt  Labor: early phase Preeclampsia:  n/a Fetal Wellbeing:  Category II Pain Control:  Epidural I/D:  n/a Anticipated MOD:  NSVD w/ possibility of c/s  Strip and poc reviewed w/ dr. Janene Harvey, Cheron Every 10/25/2012, 3:33 PM

## 2012-10-25 NOTE — Progress Notes (Signed)
Jacqueline Mcdaniel is a 37 y.o. [id number] at [redacted]w[redacted]d admitted for postdates  Subjective: Uncomfortable w/ uc's, requesting iv pain meds. Requesting I look at perineum to make sure she doesn't have any HSV II lesions.   Denies seeing/feeling lesions.  Reports taking antiviral as prescribed. Family supportive at bs.   Objective: BP 112/67  Pulse 94  Temp(Src) 98.3 F (36.8 C) (Oral)  Resp 20  Ht 5\' 2"  (1.575 m)  Wt 107.956 kg (238 lb)  BMI 43.52 kg/m2      FHT:  FHR: 145 bpm, variability: moderate,  accelerations:  Present,  decelerations:  Absent UC:   regular, every 1-3 minutes SVE:   2/50/-2, soft, vtx, post Cervical foley bulb inserted and inflated w/ 60ml LR w/o difficulty   No genital lesions identified.  Some varicosities noted.   Labs: Lab Results  Component Value Date   WBC 9.4 10/24/2012   HGB 12.5 10/24/2012   HCT 37.7 10/24/2012   MCV 88.7 10/24/2012   PLT 241 10/24/2012    Assessment / Plan: IOL d/t postdates, s/p cytotec x 1 late last night, cervical foley bulb now placed.  Will hold off on pitocin for now, as pt is having regular painful uc's on her own  Labor: cervical ripening phase Preeclampsia:  n/a Fetal Wellbeing:  Category I Pain Control:  requesting iv pain meds I/D:  n/a Anticipated MOD:  NSVD  Marge Duncans 10/25/2012, 9:55 AM

## 2012-10-26 MED ORDER — TETANUS-DIPHTH-ACELL PERTUSSIS 5-2.5-18.5 LF-MCG/0.5 IM SUSP
0.5000 mL | Freq: Once | INTRAMUSCULAR | Status: AC
  Administered 2012-10-26: 0.5 mL via INTRAMUSCULAR
  Filled 2012-10-26: qty 0.5

## 2012-10-26 NOTE — Anesthesia Postprocedure Evaluation (Signed)
  Anesthesia Post-op Note  Patient: Jacqueline Mcdaniel  Procedure(s) Performed: * No procedures listed *  Patient Location: PACU and Mother/Baby  Anesthesia Type:Epidural  Level of Consciousness: awake, alert  and oriented  Airway and Oxygen Therapy: Patient Spontanous Breathing  Post-op Pain: mild  Post-op Assessment: Patient's Cardiovascular Status Stable, Respiratory Function Stable, No signs of Nausea or vomiting, Adequate PO intake, Pain level controlled, No headache, No backache, No residual numbness and No residual motor weakness  Post-op Vital Signs: stable  Complications: No apparent anesthesia complications

## 2012-10-26 NOTE — Progress Notes (Signed)
Post Partum Day 1 Subjective: Eating, drinking, voiding, ambulating well. Lochia and pain wnl. Reports cramping at present and has just called for med.   Objective: Blood pressure 119/81, pulse 94, temperature 97.9 F (36.6 C), temperature source Oral, resp. rate 20, height 5\' 2"  (1.575 m), weight 107.956 kg (238 lb), SpO2 97.00%, unknown if currently breastfeeding.  Physical Exam:  General: alert, cooperative and no distress Lochia: appropriate Uterine Fundus: firm Incision: n/a DVT Evaluation: No evidence of DVT seen on physical exam. Negative Homan's sign. No cords or calf tenderness. No significant calf/ankle edema.   Recent Labs  10/24/12 2230  HGB 12.5  HCT 37.7    Assessment/Plan: Plan for discharge tomorrow, Breastfeeding, Lactation consult, Circumcision prior to discharge and Contraception OCPs   LOS: 2 days   Marge Duncans 10/26/2012, 6:00 AM

## 2012-10-27 MED ORDER — IBUPROFEN 600 MG PO TABS: 600.0000 mg | ORAL_TABLET | Freq: Four times a day (QID) | ORAL | Status: DC

## 2012-10-27 MED ORDER — NORETHINDRONE 0.35 MG PO TABS: 1.0000 | ORAL_TABLET | Freq: Every day | ORAL | Status: DC

## 2012-10-27 MED ORDER — DOCUSATE SODIUM 100 MG PO CAPS: 100.0000 mg | ORAL_CAPSULE | Freq: Two times a day (BID) | ORAL | Status: DC | PRN

## 2012-10-27 NOTE — Discharge Summary (Signed)
Obstetric Discharge Summary Reason for Admission: induction of labor for postdates. Prenatal Procedures: ultrasound Intrapartum Procedures: vacuum delivery for severe variable decels Postpartum Procedures: none Complications-Operative and Postpartum: none Hemoglobin  Date Value Range Status  10/24/2012 12.5  12.0 - 15.0 g/dL Final     HCT  Date Value Range Status  10/24/2012 37.7  36.0 - 46.0 % Final    Physical Exam:  General: alert, cooperative, appears stated age and no distress Lochia: appropriate Uterine Fundus: firm Incision: NA DVT Evaluation: Negative Homan's sign.  Discharge Diagnoses: Term Pregnancy-delivered  Discharge Information: Date: 10/27/2012 Activity: pelvic rest Diet: routine Medications: PNV, Ibuprofen, Colace and Micronor Condition: stable Instructions: refer to practice specific booklet Discharge to: home Follow-up Information   Follow up with [facility] In 6 weeks.   Contact information:   [address] [place] [address] [phone]      Follow up with [facility] [place] MATERNITY ADMISSIONS. (As needed for emergencies)    Contact information:   [address] [id number] [place] [address] [phone]      Newborn Data: Live born female  Birth Weight: 7 lb 5.8 oz (3340 g) APGAR: 9, 9  Home with mother.  Jacqueline Mcdaniel 10/27/2012, 7:21 AM

## 2012-11-27 ENCOUNTER — Ambulatory Visit: Payer: Self-pay | Admitting: Obstetrics and Gynecology

## 2012-12-04 ENCOUNTER — Encounter: Payer: Self-pay | Admitting: *Deleted

## 2012-12-04 DIAGNOSIS — B009 Herpesviral infection, unspecified: Secondary | ICD-10-CM

## 2012-12-05 ENCOUNTER — Ambulatory Visit (INDEPENDENT_AMBULATORY_CARE_PROVIDER_SITE_OTHER): Payer: BC Managed Care – PPO | Admitting: Obstetrics & Gynecology

## 2012-12-05 ENCOUNTER — Ambulatory Visit: Payer: Self-pay | Admitting: Obstetrics and Gynecology

## 2012-12-05 ENCOUNTER — Encounter: Payer: Self-pay | Admitting: Obstetrics & Gynecology

## 2012-12-05 NOTE — Patient Instructions (Addendum)
Postpartum Care After Vaginal Delivery  After you deliver your baby, you will stay in the hospital for 24 to 72 hours, unless there were problems with the labor or delivery, or you have medical problems. While you are in the hospital, you will receive help and instructions on how to care for yourself and your baby.  Your doctor will order pain medicine, in case you need it. You will have a small amount of bleeding from your vagina and should change your sanitary pad frequently. Wash your hands thoroughly with soap and water for at least 20 seconds after changing pads and using the toilet. Let the nurses know if you begin to pass blood clots or your bleeding increases. Do not flush blood clots down the toilet before having the nurse look at them, to make sure there is no placental tissue with them.  If you had an intravenous (IV), it will be removed within 24 hours, if there are no problems. The first time you get out of bed or take a shower, call the nurse to help you because you may get weak, lightheaded, or even faint. If you are breastfeeding, you may feel painful contractions of your uterus for a couple of weeks. This is normal. The contractions help your uterus get back to normal size. If you are not breastfeeding, wear a supportive bra and handle your breasts as little as possible until your milk has dried up. Hormones should not be given to dry up the breasts, because they can cause blood clots. You will be given your normal diet, unless you have diabetes or other medical problems.   The nurses may put an ice pack on your episiotomy (surgically enlarged opening), if you have one, to reduce the pain and swelling. On rare occasions, you may not be able to urinate and the nurse will need to empty your bladder with a catheter. If you had a postpartum tubal ligation ("tying tubes," female sterilization), it should not make your stay in the hospital longer.  You may have your baby in your room with you as much as  you like, unless you or the baby has a problem. Use the bassinet (basket) for the baby when going to and from the nursery. Do not carry the baby. Do not leave the postpartum area. If the mother is Rh negative (lacks a protein on the red blood cells) and the baby is Rh positive, the mother should get a Rho-gam shot to prevent Rh problems with future pregnancies.  You may be given written instructions for you and your baby, and necessary medicines, when you are discharged from the hospital. Be sure you understand and follow the instructions as advised.  HOME CARE INSTRUCTIONS    Follow instructions and take the medicines given to you.   Only take over-the-counter or prescription medicines for pain, discomfort, or fever as directed by your caregiver.   Do not take aspirin, because it can cause bleeding.   Increase your activities a little bit every day to build up your strength and endurance.   Do not drink alcohol, especially if you are breastfeeding or taking pain medicine.   Take your temperature twice a day and record it.   You may have a small amount of bleeding or spotting for 2 to 4 weeks. This is normal.   Do not use tampons or douche. Use sanitary pads.   Try to have someone stay and help you for a few days when you go home.     Try to rest or take a nap when the baby is sleeping.   If you are breastfeeding, wear a good support bra. If you are not breastfeeding, wear a supportive bra and do not stimulate your nipples.   Eat a healthy, nutritious diet and continue to take your prenatal vitamins.   Do not drive, do any heavy activities, or travel until your caregiver tells you it is okay.   Do not have intercourse until your caregiver gives you permission to do so.   Ask your caregiver when you can begin to exercise and what type of exercises to do.   Call your caregiver if you think you are having a problem from your delivery.   Call your pediatrician if you are having a problem with the  baby.   Schedule your postpartum visit and keep it.  SEEK MEDICAL CARE IF:    You have a temperature of 100 F (37.8 C) or higher.   You have increased vaginal bleeding or are passing clots. Save any clots to show your caregiver.   You have bloody urine or pain when you urinate.   You have a bad smelling vaginal discharge.   You have increasing pain or swelling on your episiotomy.   You develop a severe headache.   You feel depressed.   The episiotomy is separating.   You become dizzy or lightheaded.   You develop a rash.   You have a reaction or problems with your medicine.   You have pain, redness, or swelling at the intravenous site.  SEEK IMMEDIATE MEDICAL CARE IF:    You have chest pain.   You develop shortness of breath.   You pass out.   You develop pain, with or without swelling or redness in your leg.   You develop heavy vaginal bleeding, with or without blood clots.   You develop stomach pain.   You develop a bad smelling vaginal discharge.  MAKE SURE YOU:    Understand these instructions.   Will watch your condition.   Will get help right away if you are not doing well or get worse.  Document Released: 06/24/2007 Document Revised: 11/19/2011 Document Reviewed: 07/06/2009  Exit[employer] Patient Information 2013 [employer]

## 2012-12-05 NOTE — Progress Notes (Signed)
Patient ID: Jacqueline Mcdaniel, female   DOB: 01-24-1976, 37 y.o.   MRN: [id number] No complaints today, started her period 3 days ago.  Cramping. Bottle feeding Has micronor when needs it  Exam  Nefg, uterus normally involuted non tender  Depression scale 9  See note  Normal post partum exam follow up for yearly as needed  Dudley Cooley H 12/05/2012 12:57 PM

## 2013-01-29 ENCOUNTER — Other Ambulatory Visit (HOSPITAL_COMMUNITY): Payer: Self-pay | Admitting: Obstetrics & Gynecology

## 2013-02-04 ENCOUNTER — Other Ambulatory Visit (HOSPITAL_COMMUNITY): Payer: Self-pay | Admitting: Obstetrics & Gynecology

## 2014-07-12 ENCOUNTER — Encounter: Payer: Self-pay | Admitting: Obstetrics & Gynecology

## 2014-08-16 ENCOUNTER — Other Ambulatory Visit: Payer: Self-pay | Admitting: Obstetrics & Gynecology

## 2016-03-29 ENCOUNTER — Other Ambulatory Visit: Payer: Self-pay | Admitting: Obstetrics & Gynecology

## 2017-07-30 ENCOUNTER — Other Ambulatory Visit: Payer: Self-pay | Admitting: Obstetrics & Gynecology

## 2018-04-28 ENCOUNTER — Encounter: Payer: BLUE CROSS/BLUE SHIELD | Admitting: Obstetrics & Gynecology

## 2018-04-28 ENCOUNTER — Ambulatory Visit: Payer: BLUE CROSS/BLUE SHIELD | Admitting: Obstetrics & Gynecology

## 2018-04-28 ENCOUNTER — Encounter (INDEPENDENT_AMBULATORY_CARE_PROVIDER_SITE_OTHER): Payer: Self-pay

## 2018-04-28 ENCOUNTER — Other Ambulatory Visit: Payer: Self-pay

## 2018-04-28 ENCOUNTER — Encounter: Payer: Self-pay | Admitting: Obstetrics & Gynecology

## 2018-04-28 ENCOUNTER — Other Ambulatory Visit (HOSPITAL_COMMUNITY)
Admission: RE | Admit: 2018-04-28 | Discharge: 2018-04-28 | Disposition: A | Discharge status: Home / Self Care | Payer: BLUE CROSS/BLUE SHIELD | Source: Ambulatory Visit | Attending: Obstetrics and Gynecology | Admitting: Obstetrics and Gynecology

## 2018-04-28 VITALS — BP 137/84 | HR 91 | Ht 61.0 in | Wt 263.0 lb

## 2018-04-28 DIAGNOSIS — Z1151 Encounter for screening for human papillomavirus (HPV): Secondary | ICD-10-CM | POA: Insufficient documentation

## 2018-04-28 DIAGNOSIS — Z01419 Encounter for gynecological examination (general) (routine) without abnormal findings: Secondary | ICD-10-CM | POA: Insufficient documentation

## 2018-04-28 MED ORDER — VALACYCLOVIR HCL 500 MG PO TABS: 500.0000 mg | ORAL_TABLET | Freq: Every day | ORAL | 4 refills | Status: DC

## 2018-04-28 NOTE — Progress Notes (Signed)
Subjective:     Jacqueline RocherSherita L Mcdaniel is a 42 y.o. female here for a routine exam.  Patient's last menstrual period was 03/29/2018. [id number] Birth Control Method:  Abstinence has not had sex in 4-5 years Menstrual Calendar(currently): regular  Current complaints: occasional discharge, wet prep today is normal and there is minimal discharge today.   Current acute medical issues:  none   Recent Gynecologic History Patient's last menstrual period was 03/29/2018. Last Pap: 2013,  normal Last mammogram: none,    Past Medical History:  Diagnosis Date  . Abnormal Pap smear   . AMA (advanced maternal age) multigravida 35+   . HSV (herpes simplex virus) anogenital infection     Past Surgical History:  Procedure Laterality Date  . NO PAST SURGERIES      OB History    Gravida  5   Para  3   Term  3   Preterm      AB  2   Living  3     SAB  1   TAB  1   Ectopic      Multiple      Live Births  3           Social History   Socioeconomic History  . Marital status: Single    Spouse name: Not on file  . Number of children: Not on file  . Years of education: Not on file  . Highest education level: Not on file  Occupational History  . Not on file  Social Needs  . Financial resource strain: Not on file  . Food insecurity:    Worry: Not on file    Inability: Not on file  . Transportation needs:    Medical: Not on file    Non-medical: Not on file  Tobacco Use  . Smoking status: Never Smoker  . Smokeless tobacco: Never Used  Substance and Sexual Activity  . Alcohol use: No  . Drug use: No  . Sexual activity: Not Currently    Birth control/protection: None  Lifestyle  . Physical activity:    Days per week: Not on file    Minutes per session: Not on file  . Stress: Not on file  Relationships  . Social connections:    Talks on phone: Not on file    Gets together: Not on file    Attends religious service: Not on file    Active member of club or organization:  Not on file    Attends meetings of clubs or organizations: Not on file    Relationship status: Not on file  Other Topics Concern  . Not on file  Social History Narrative  . Not on file    Family History  Problem Relation Age of Onset  . Hypertension Mother   . Diabetes Father   . Hypertension Father   . Heart attack Father   . Hypertension Paternal Grandfather   . Hypertension Paternal Grandmother   . Hypertension Brother      Current Outpatient Medications:  .  valACYclovir (VALTREX) 500 MG tablet, Take 1 tablet (500 mg total) by mouth daily., Disp: 30 tablet, Rfl: 4  Review of Systems  Review of Systems  Constitutional: Negative for fever, chills, weight loss, malaise/fatigue and diaphoresis.  HENT: Negative for hearing loss, ear pain, nosebleeds, congestion, sore throat, neck pain, tinnitus and ear discharge.   Eyes: Negative for blurred vision, double vision, photophobia, pain, discharge and redness.  Respiratory: Negative for cough, hemoptysis, sputum  production, shortness of breath, wheezing and stridor.   Cardiovascular: Negative for chest pain, palpitations, orthopnea, claudication, leg swelling and PND.  Gastrointestinal: negative for abdominal pain. Negative for heartburn, nausea, vomiting, diarrhea, constipation, blood in stool and melena.  Genitourinary: Negative for dysuria, urgency, frequency, hematuria and flank pain.  Musculoskeletal: Negative for myalgias, back pain, joint pain and falls.  Skin: Negative for itching and rash.  Neurological: Negative for dizziness, tingling, tremors, sensory change, speech change, focal weakness, seizures, loss of consciousness, weakness and headaches.  Endo/Heme/Allergies: Negative for environmental allergies and polydipsia. Does not bruise/bleed easily.  Psychiatric/Behavioral: Negative for depression, suicidal ideas, hallucinations, memory loss and substance abuse. The patient is not nervous/anxious and does not have insomnia.         Objective:  Blood pressure 137/84, pulse 91, height 5\' 1"  (1.549 m), weight 263 lb (119.3 kg), last menstrual period 03/29/2018.   Physical Exam  Vitals reviewed. Constitutional: She is oriented to person, place, and time. She appears well-developed and well-nourished.  HENT:  Head: Normocephalic and atraumatic.        Right Ear: External ear normal.  Left Ear: External ear normal.  Nose: Nose normal.  Mouth/Throat: Oropharynx is clear and moist.  Eyes: Conjunctivae and EOM are normal. Pupils are equal, round, and reactive to light. Right eye exhibits no discharge. Left eye exhibits no discharge. No scleral icterus.  Neck: Normal range of motion. Neck supple. No tracheal deviation present. No thyromegaly present.  Cardiovascular: Normal rate, regular rhythm, normal heart sounds and intact distal pulses.  Exam reveals no gallop and no friction rub.   No murmur heard. Respiratory: Effort normal and breath sounds normal. No respiratory distress. She has no wheezes. She has no rales. She exhibits no tenderness.  GI: Soft. Bowel sounds are normal. She exhibits no distension and no mass. There is no tenderness. There is no rebound and no guarding.  Genitourinary:  Breasts no masses skin changes or nipple changes bilaterally      Vulva is normal without lesions Vagina is pink moist without discharge Cervix normal in appearance and pap is done Uterus is normal size shape and contour Adnexa is negative with normal sized ovaries   Musculoskeletal: Normal range of motion. She exhibits no edema and no tenderness.  Neurological: She is alert and oriented to person, place, and time. She has normal reflexes. She displays normal reflexes. No cranial nerve deficit. She exhibits normal muscle tone. Coordination normal.  Skin: Skin is warm and dry. No rash noted. No erythema. No pallor.  Psychiatric: She has a normal mood and affect. Her behavior is normal. Judgment and thought content normal.        Medications Ordered at today's visit: Meds ordered this encounter  Medications  . valACYclovir (VALTREX) 500 MG tablet    Sig: Take 1 tablet (500 mg total) by mouth daily.    Dispense:  30 tablet    Refill:  4    Other orders placed at today's visit: No orders of the defined types were placed in this encounter.     Assessment:    Healthy female exam.    Plan:    Contraception: abstinence. Mammogram ordered. Follow up in: 1 year.     Return in about 1 year (around 04/29/2019).

## 2018-04-30 LAB — CYTOLOGY - PAP
Diagnosis: NEGATIVE
HPV: NOT DETECTED

## 2018-05-01 ENCOUNTER — Telehealth: Payer: Self-pay | Admitting: Obstetrics & Gynecology

## 2018-05-01 ENCOUNTER — Telehealth: Payer: Self-pay | Admitting: *Deleted

## 2018-05-01 MED ORDER — DESOGESTREL-ETHINYL ESTRADIOL 0.15-0.02/0.01 MG (21/5) PO TABS: 1.0000 | ORAL_TABLET | Freq: Every day | ORAL | 11 refills | Status: DC

## 2018-05-01 NOTE — Telephone Encounter (Signed)
Spoke with pt. Pt is wanting to start birth control pills. Was seen in office on Monday. Thanks!! JSY

## 2018-05-01 NOTE — Telephone Encounter (Signed)
done

## 2018-11-18 ENCOUNTER — Other Ambulatory Visit: Payer: Self-pay | Admitting: Obstetrics & Gynecology

## 2019-08-18 ENCOUNTER — Telehealth: Payer: Self-pay | Admitting: *Deleted

## 2019-08-18 ENCOUNTER — Other Ambulatory Visit: Payer: Self-pay | Admitting: *Deleted

## 2019-08-18 MED ORDER — VALACYCLOVIR HCL 500 MG PO TABS: 500.0000 mg | ORAL_TABLET | Freq: Every day | ORAL | 4 refills | Status: DC

## 2019-08-18 MED ORDER — DESOGESTREL-ETHINYL ESTRADIOL 0.15-0.02/0.01 MG (21/5) PO TABS: 1.0000 | ORAL_TABLET | Freq: Every day | ORAL | 11 refills | Status: DC

## 2019-08-18 NOTE — Telephone Encounter (Signed)
Patient left message that she needs someone to call her back regarding a refill.

## 2019-12-14 ENCOUNTER — Telehealth: Payer: Self-pay | Admitting: Adult Health

## 2019-12-14 NOTE — Telephone Encounter (Signed)
error 

## 2019-12-15 ENCOUNTER — Telehealth: Payer: Self-pay | Admitting: Advanced Practice Midwife

## 2019-12-15 NOTE — Telephone Encounter (Signed)

## 2019-12-16 ENCOUNTER — Encounter: Payer: Self-pay | Admitting: Advanced Practice Midwife

## 2019-12-16 ENCOUNTER — Ambulatory Visit: Payer: BC Managed Care – PPO | Admitting: Advanced Practice Midwife

## 2019-12-16 ENCOUNTER — Other Ambulatory Visit: Payer: Self-pay

## 2019-12-16 VITALS — Ht 61.0 in | Wt 233.0 lb

## 2019-12-16 DIAGNOSIS — R8290 Unspecified abnormal findings in urine: Secondary | ICD-10-CM

## 2019-12-16 DIAGNOSIS — N3000 Acute cystitis without hematuria: Secondary | ICD-10-CM

## 2019-12-16 LAB — POCT URINALYSIS DIPSTICK
Blood, UA: NEGATIVE
Glucose, UA: NEGATIVE
Leukocytes, UA: NEGATIVE
Nitrite, UA: POSITIVE
Protein, UA: NEGATIVE

## 2019-12-16 MED ORDER — VALACYCLOVIR HCL 500 MG PO TABS: 500.0000 mg | ORAL_TABLET | Freq: Every day | ORAL | 4 refills | Status: DC

## 2019-12-16 MED ORDER — NITROFURANTOIN MONOHYD MACRO 100 MG PO CAPS: 100.0000 mg | ORAL_CAPSULE | Freq: Two times a day (BID) | ORAL | 0 refills | Status: DC

## 2019-12-16 NOTE — Patient Instructions (Signed)
Urinary Tract Infection, Adult A urinary tract infection (UTI) is an infection of any part of the urinary tract. The urinary tract includes:  The kidneys.  The ureters.  The bladder.  The urethra. These organs make, store, and get rid of pee (urine) in the body. What are the causes? This is caused by germs (bacteria) in your genital area. These germs grow and cause swelling (inflammation) of your urinary tract. What increases the risk? You are more likely to develop this condition if:  You have a small, thin tube (catheter) to drain pee.  You cannot control when you pee or poop (incontinence).  You are female, and: ? You use these methods to prevent pregnancy:  A medicine that kills sperm (spermicide).  A device that blocks sperm (diaphragm). ? You have low levels of a female hormone (estrogen). ? You are pregnant.  You have genes that add to your risk.  You are sexually active.  You take antibiotic medicines.  You have trouble peeing because of: ? A prostate that is bigger than normal, if you are female. ? A blockage in the part of your body that drains pee from the bladder (urethra). ? A kidney stone. ? A nerve condition that affects your bladder (neurogenic bladder). ? Not getting enough to drink. ? Not peeing often enough.  You have other conditions, such as: ? Diabetes. ? A weak disease-fighting system (immune system). ? Sickle cell disease. ? Gout. ? Injury of the spine. What are the signs or symptoms? Symptoms of this condition include:  Needing to pee right away (urgently).  Peeing often.  Peeing small amounts often.  Pain or burning when peeing.  Blood in the pee.  Pee that smells bad or not like normal.  Trouble peeing.  Pee that is cloudy.  Fluid coming from the vagina, if you are female.  Pain in the belly or lower back. Other symptoms include:  Throwing up (vomiting).  No urge to eat.  Feeling mixed up (confused).  Being tired  and grouchy (irritable).  A fever.  Watery poop (diarrhea). How is this treated? This condition may be treated with:  Antibiotic medicine.  Other medicines.  Drinking enough water. Follow these instructions at home:  Medicines  Take over-the-counter and prescription medicines only as told by your doctor.  If you were prescribed an antibiotic medicine, take it as told by your doctor. Do not stop taking it even if you start to feel better. General instructions  Make sure you: ? Pee until your bladder is empty. ? Do not hold pee for a long time. ? Empty your bladder after sex. ? Wipe from front to back after pooping if you are a female. Use each tissue one time when you wipe.  Drink enough fluid to keep your pee pale yellow.  Keep all follow-up visits as told by your doctor. This is important. Contact a doctor if:  You do not get better after 1-2 days.  Your symptoms go away and then come back. Get help right away if:  You have very bad back pain.  You have very bad pain in your lower belly.  You have a fever.  You are sick to your stomach (nauseous).  You are throwing up. Summary  A urinary tract infection (UTI) is an infection of any part of the urinary tract.  This condition is caused by germs in your genital area.  There are many risk factors for a UTI. These include having a small, thin   tube to drain pee and not being able to control when you pee or poop.  Treatment includes antibiotic medicines for germs.  Drink enough fluid to keep your pee pale yellow. This information is not intended to replace advice given to you by your health care provider. Make sure you discuss any questions you have with your health care provider. Document Revised: 08/14/2018 Document Reviewed: 03/06/2018 Elsevier Patient Education  2020 [employer]  

## 2019-12-16 NOTE — Progress Notes (Signed)
   GYN VISIT Patient name: Jacqueline Mcdaniel MRN [id number]  Date of birth: July 28, 1976 Chief Complaint:   urine has strong odor  History of Present Illness:   Jacqueline Mcdaniel is a 44 y.o. [phone] African American female being seen today for dysuria, malodorous urine, and occ low back pain x a few days now.  No fever.  Depression screen [facility] 2/9 04/28/2018  Decreased Interest 0  Down, Depressed, Hopeless 0  PHQ - 2 Score 0    Patient's last menstrual period was 11/29/2019. The current method of family planning is not discussed.  Last pap Aug 2019. Results were:  normal Review of Systems:   Pertinent items are noted in HPI Denies fever/chills, dizziness, headaches, visual disturbances, fatigue, shortness of breath, chest pain, abdominal pain, vomiting, abnormal vaginal discharge/itching/odor/irritation, problems with periods, bowel movements, urination, or intercourse unless otherwise stated above.  Pertinent History Reviewed:  Reviewed past medical,surgical, social, obstetrical and family history.  Reviewed problem list, medications and allergies. Physical Assessment:   Vitals:   12/16/19 0926  Weight: 233 lb (105.7 kg)  Height: 5\' 1"  (1.549 m)  Body mass index is 44.02 kg/m.       Physical Examination:   General appearance: alert, well appearing, and in no distress  Mental status: alert, oriented to person, place, and time  Skin: warm & dry   Cardiovascular: normal heart rate noted  Respiratory: normal respiratory effort, no distress  Back: neg CVA tenderness bilat  Abdomen: soft, non-tender   Pelvic: examination not indicated  Extremities: no edema   Chaperone: n/a    Results for orders placed or performed in visit on 12/16/19 (from the past 24 hour(s))  POCT Urinalysis Dipstick   Collection Time: 12/16/19  9:30 AM  Result Value Ref Range   Color, UA     Clarity, UA     Glucose, UA Negative Negative   Bilirubin, UA     Ketones, UA 2+    Spec Grav, UA     Blood, UA  neg    pH, UA     Protein, UA Negative Negative   Urobilinogen, UA     Nitrite, UA positive    Leukocytes, UA Negative Negative   Appearance     Odor      Assessment & Plan:  1) Acute cystitis> rx Macrobid 100mg  bid x 7d and to culture; increase water intake; s/s of pyelo reviewed and when to call  2) HSV> refilled daily Valtrex  Meds:  Meds ordered this encounter  Medications  . nitrofurantoin, macrocrystal-monohydrate, (MACROBID) 100 MG capsule    Sig: Take 1 capsule (100 mg total) by mouth 2 (two) times daily.    Dispense:  14 capsule    Refill:  0    Order Specific Question:   Supervising Provider    Answer:   02/15/20 [[id number]]  . valACYclovir (VALTREX) 500 MG tablet    Sig: Take 1 tablet (500 mg total) by mouth daily.    Dispense:  30 tablet    Refill:  4    Order Specific Question:   Supervising Provider    Answer:   [[id number]]    Orders Placed This Encounter  Procedures  . Urine Culture  . POCT Urinalysis Dipstick    Return for prn, Physical.  Tilda Burrow [facility] 12/16/2019 9:55 AM

## 2019-12-18 ENCOUNTER — Ambulatory Visit: Payer: BLUE CROSS/BLUE SHIELD | Admitting: Adult Health

## 2019-12-18 LAB — URINE CULTURE

## 2019-12-18 NOTE — Progress Notes (Signed)
Please let Jacqueline Mcdaniel know that her urine culture came back negative, so she can stop taking the antibiotic. Encourage her to drink at least 64oz of water qd.

## 2019-12-21 ENCOUNTER — Telehealth: Payer: Self-pay | Admitting: *Deleted

## 2019-12-21 NOTE — Telephone Encounter (Signed)
LMOVM per Selena Batten, urine culture negative and can stop taking antibiotics.  Encouraged to push at least 51fl oz of water per day.  Advise to call back for further questions.

## 2022-07-24 ENCOUNTER — Encounter: Payer: Self-pay | Admitting: Obstetrics & Gynecology

## 2022-07-24 ENCOUNTER — Other Ambulatory Visit (HOSPITAL_COMMUNITY)
Admission: RE | Admit: 2022-07-24 | Discharge: 2022-07-24 | Disposition: A | Discharge status: Home / Self Care | Payer: BC Managed Care – PPO | Source: Ambulatory Visit | Attending: Obstetrics & Gynecology | Admitting: Obstetrics & Gynecology

## 2022-07-24 ENCOUNTER — Ambulatory Visit (INDEPENDENT_AMBULATORY_CARE_PROVIDER_SITE_OTHER): Payer: BC Managed Care – PPO | Admitting: Obstetrics & Gynecology

## 2022-07-24 VITALS — BP 136/78 | HR 84 | Ht 61.0 in

## 2022-07-24 DIAGNOSIS — Z01419 Encounter for gynecological examination (general) (routine) without abnormal findings: Secondary | ICD-10-CM | POA: Diagnosis present

## 2022-07-24 DIAGNOSIS — Z1231 Encounter for screening mammogram for malignant neoplasm of breast: Secondary | ICD-10-CM

## 2022-07-24 DIAGNOSIS — Z113 Encounter for screening for infections with a predominantly sexual mode of transmission: Secondary | ICD-10-CM | POA: Diagnosis present

## 2022-07-24 MED ORDER — VALACYCLOVIR HCL 500 MG PO TABS: 500.0000 mg | ORAL_TABLET | Freq: Every day | ORAL | 11 refills | Status: DC

## 2022-07-24 MED ORDER — NORETHINDRONE 0.35 MG PO TABS: 1.0000 | ORAL_TABLET | Freq: Every day | ORAL | 11 refills | Status: DC

## 2022-07-24 NOTE — Progress Notes (Signed)
Subjective:     Jacqueline Mcdaniel is a 46 y.o. female here for a routine exam.  Patient's last menstrual period was 07/03/2022. [id number] Birth Control Method:  abstinence Menstrual Calendar(currently): a bit irregular  Current complaints: none.   Current acute medical issues:  obesity   Recent Gynecologic History Patient's last menstrual period was 07/03/2022. Last Pap: 2019,  normal Last mammogram: ordered,    Past Medical History:  Diagnosis Date   Abnormal Pap smear    AMA (advanced maternal age) multigravida 35+    HSV (herpes simplex virus) anogenital infection     Past Surgical History:  Procedure Laterality Date   NO PAST SURGERIES      OB History     Gravida  5   Para  3   Term  3   Preterm      AB  2   Living  3      SAB  1   IAB  1   Ectopic      Multiple      Live Births  3           Social History   Socioeconomic History   Marital status: Single    Spouse name: Not on file   Number of children: Not on file   Years of education: Not on file   Highest education level: Not on file  Occupational History   Not on file  Tobacco Use   Smoking status: Never   Smokeless tobacco: Never  Vaping Use   Vaping Use: Never used  Substance and Sexual Activity   Alcohol use: No   Drug use: No   Sexual activity: Not Currently    Birth control/protection: None  Other Topics Concern   Not on file  Social History Narrative   Not on file   Social Determinants of Health   Financial Resource Strain: Medium Risk (07/24/2022)   Overall Financial Resource Strain (CARDIA)    Difficulty of Paying Living Expenses: Somewhat hard  Food Insecurity: No Food Insecurity (07/24/2022)   Hunger Vital Sign    Worried About Running Out of Food in the Last Year: Never true    Ran Out of Food in the Last Year: Never true  Transportation Needs: No Transportation Needs (07/24/2022)   PRAPARE - Administrator, Civil Service (Medical): No    Lack of  Transportation (Non-Medical): No  Physical Activity: Insufficiently Active (07/24/2022)   Exercise Vital Sign    Days of Exercise per Week: 1 day    Minutes of Exercise per Session: 30 min  Stress: No Stress Concern Present (07/24/2022)   [employer] of Occupational Health - Occupational Stress Questionnaire    Feeling of Stress : Only a little  Social Connections: Moderately Isolated (07/24/2022)   Social Connection and Isolation Panel [NHANES]    Frequency of Communication with Friends and Family: More than three times a week    Frequency of Social Gatherings with Friends and Family: Once a week    Attends Religious Services: 1 to 4 times per year    Active Member of [employer] or Organizations: No    Attends Banker Meetings: Never    Marital Status: Never married    Family History  Problem Relation Age of Onset   Hypertension Mother    Diabetes Father    Hypertension Father    Heart attack Father    Hypertension Paternal Grandfather    Hypertension Paternal Grandmother  Hypertension Brother      Current Outpatient Medications:    norethindrone (MICRONOR) 0.35 MG tablet, Take 1 tablet (0.35 mg total) by mouth daily., Disp: 28 tablet, Rfl: 11   valACYclovir (VALTREX) 500 MG tablet, Take 1 tablet (500 mg total) by mouth daily., Disp: 30 tablet, Rfl: 11  Review of Systems  Review of Systems  Constitutional: Negative for fever, chills, weight loss, malaise/fatigue and diaphoresis.  HENT: Negative for hearing loss, ear pain, nosebleeds, congestion, sore throat, neck pain, tinnitus and ear discharge.   Eyes: Negative for blurred vision, double vision, photophobia, pain, discharge and redness.  Respiratory: Negative for cough, hemoptysis, sputum production, shortness of breath, wheezing and stridor.   Cardiovascular: Negative for chest pain, palpitations, orthopnea, claudication, leg swelling and PND.  Gastrointestinal: negative for abdominal pain. Negative for  heartburn, nausea, vomiting, diarrhea, constipation, blood in stool and melena.  Genitourinary: Negative for dysuria, urgency, frequency, hematuria and flank pain.  Musculoskeletal: Negative for myalgias, back pain, joint pain and falls.  Skin: Negative for itching and rash.  Neurological: Negative for dizziness, tingling, tremors, sensory change, speech change, focal weakness, seizures, loss of consciousness, weakness and headaches.  Endo/Heme/Allergies: Negative for environmental allergies and polydipsia. Does not bruise/bleed easily.  Psychiatric/Behavioral: Negative for depression, suicidal ideas, hallucinations, memory loss and substance abuse. The patient is not nervous/anxious and does not have insomnia.        Objective:  Blood pressure 136/78, pulse 84, height 5\' 1"  (1.549 m), last menstrual period 07/03/2022.   Physical Exam  Vitals reviewed. Constitutional: She is oriented to person, place, and time. She appears well-developed and well-nourished.  HENT:  Head: Normocephalic and atraumatic.        Right Ear: External ear normal.  Left Ear: External ear normal.  Nose: Nose normal.  Mouth/Throat: Oropharynx is clear and moist.  Eyes: Conjunctivae and EOM are normal. Pupils are equal, round, and reactive to light. Right eye exhibits no discharge. Left eye exhibits no discharge. No scleral icterus.  Neck: Normal range of motion. Neck supple. No tracheal deviation present. No thyromegaly present.  Cardiovascular: Normal rate, regular rhythm, normal heart sounds and intact distal pulses.  Exam reveals no gallop and no friction rub.   No murmur heard. Respiratory: Effort normal and breath sounds normal. No respiratory distress. She has no wheezes. She has no rales. She exhibits no tenderness.  GI: Soft. Bowel sounds are normal. She exhibits no distension and no mass. There is no tenderness. There is no rebound and no guarding.  Genitourinary:  Breasts no masses skin changes or nipple  changes bilaterally      Vulva is normal without lesions Vagina is pink moist without discharge Cervix normal in appearance and pap is done Uterus is normal size shape and contour Adnexa is negative with normal sized ovaries   Musculoskeletal: Normal range of motion. She exhibits no edema and no tenderness.  Neurological: She is alert and oriented to person, place, and time. She has normal reflexes. She displays normal reflexes. No cranial nerve deficit. She exhibits normal muscle tone. Coordination normal.  Skin: Skin is warm and dry. No rash noted. No erythema. No pallor.  Psychiatric: She has a normal mood and affect. Her behavior is normal. Judgment and thought content normal.       Medications Ordered at today's visit: Meds ordered this encounter  Medications   valACYclovir (VALTREX) 500 MG tablet    Sig: Take 1 tablet (500 mg total) by mouth daily.  Dispense:  30 tablet    Refill:  11   norethindrone (MICRONOR) 0.35 MG tablet    Sig: Take 1 tablet (0.35 mg total) by mouth daily.    Dispense:  28 tablet    Refill:  11    Other orders placed at today's visit: Orders Placed This Encounter  Procedures   MM 3D SCREEN BREAST BILATERAL      Assessment:    Normal Gyn exam.    Plan:    Mammogram ordered. Follow up in: 3 years.     Return in about 3 years (around 07/24/2025) for yearly.

## 2022-07-30 LAB — CYTOLOGY - PAP
Chlamydia: NEGATIVE
Comment: NEGATIVE
Comment: NEGATIVE
Comment: NORMAL
Diagnosis: NEGATIVE
High risk HPV: NEGATIVE
Neisseria Gonorrhea: NEGATIVE

## 2023-12-05 ENCOUNTER — Other Ambulatory Visit: Payer: Self-pay | Admitting: Obstetrics & Gynecology

## 2024-06-30 ENCOUNTER — Ambulatory Visit: Admitting: Adult Health

## 2024-06-30 ENCOUNTER — Encounter: Payer: Self-pay | Admitting: Adult Health

## 2024-06-30 ENCOUNTER — Other Ambulatory Visit (HOSPITAL_COMMUNITY)
Admission: RE | Admit: 2024-06-30 | Discharge: 2024-06-30 | Disposition: A | Discharge status: Home / Self Care | Source: Ambulatory Visit | Attending: Adult Health | Admitting: Adult Health

## 2024-06-30 VITALS — BP 118/76 | HR 89 | Ht 62.0 in | Wt 252.0 lb

## 2024-06-30 DIAGNOSIS — Z113 Encounter for screening for infections with a predominantly sexual mode of transmission: Secondary | ICD-10-CM | POA: Insufficient documentation

## 2024-06-30 DIAGNOSIS — N898 Other specified noninflammatory disorders of vagina: Secondary | ICD-10-CM | POA: Insufficient documentation

## 2024-06-30 DIAGNOSIS — N926 Irregular menstruation, unspecified: Secondary | ICD-10-CM | POA: Diagnosis not present

## 2024-06-30 DIAGNOSIS — Z3202 Encounter for pregnancy test, result negative: Secondary | ICD-10-CM

## 2024-06-30 DIAGNOSIS — Z30011 Encounter for initial prescription of contraceptive pills: Secondary | ICD-10-CM

## 2024-06-30 LAB — POCT URINE PREGNANCY: Preg Test, Ur: NEGATIVE

## 2024-06-30 MED ORDER — NORETHINDRONE 0.35 MG PO TABS: 1.0000 | ORAL_TABLET | Freq: Every day | ORAL | 3 refills | Status: AC

## 2024-06-30 MED ORDER — FLUCONAZOLE 150 MG PO TABS: ORAL_TABLET | ORAL | 1 refills | Status: AC

## 2024-06-30 NOTE — Progress Notes (Signed)
 Subjective:     Patient ID: Jacqueline Mcdaniel, female   DOB: 01-12-1976, 48 y.o.   MRN: [id number]  HPI Jacqueline Mcdaniel is a 48 year old black female,single, [id number] in complaining of vaginal itching and discharge, was on antibiotics for ear about 2 weeks ago. She also says periods area irregular and wants to discuss birth control.     Component Value Date/Time   DIAGPAP  07/24/2022 1446    - Negative for intraepithelial lesion or malignancy (NILM)   DIAGPAP  04/28/2018 0000    NEGATIVE FOR INTRAEPITHELIAL LESIONS OR MALIGNANCY.   HPVHIGH Negative 07/24/2022 1446   ADEQPAP  07/24/2022 1446    Satisfactory for evaluation; transformation zone component PRESENT.   ADEQPAP  04/28/2018 0000    Satisfactory for evaluation  endocervical/transformation zone component PRESENT.    Review of Systems  +vaginal itching and discharge, was on antibiotics for ear about 2 weeks ago. +periods area irregular Denies MI,stroke, DVT, breast cancer or migraine with aura  Reviewed past medical,surgical, social and family history. Reviewed medications and allergies.      Objective:   Physical Exam BP 118/76 (BP Location: Left Arm, Patient Position: Sitting, Cuff Size: Large)   Pulse 89   Ht 5' 2 (1.575 m)   Wt 252 lb (114.3 kg)   BMI 46.09 kg/m  UPT is negative Skin warm and dry.Pelvic: external genitalia is normal in appearance no lesions, vagina: white discharge without odor,urethra has no lesions or masses noted, cervix:smooth and bulbous, uterus: normal size, shape and contour, non tender, no masses felt, adnexa: no masses or tenderness noted. Bladder is non tender and no masses felt. CV swab obtained.  Fall risk is low  Upstream - 06/30/24 1632       Pregnancy Intention Screening   Does the patient want to become pregnant in the next year? No    Does the patient's partner want to become pregnant in the next year? No    Would the patient like to discuss contraceptive options today? Yes       Contraception Wrap Up   Current Method Female Condom    End Method Oral Contraceptive;Female Condom    Contraception Counseling Provided Yes    How was the end contraceptive method provided? Prescription            Examination chaperoned by Clarita Salt LPN  Assessment:     1. Vaginal itching + itching Will rx diflucan \\no  sex while taking CV swab sent  2. Vaginal discharge +white discharge, nor odor CV swab sent   3. Encounter for initial prescription of contraceptive pills Will rx micronor , can start today, and use condoms for 1 pack Meds ordered this encounter  Medications   fluconazole (DIFLUCAN) 150 MG tablet    Sig: Take 1 now and 1 in 3 days    Dispense:  2 tablet    Refill:  1    Supervising Provider:   JAYNE MINDER H [[id number]]   norethindrone  (MICRONOR ) 0.35 MG tablet    Sig: Take 1 tablet (0.35 mg total) by mouth daily.    Dispense:  84 tablet    Refill:  3    Supervising Provider:   JAYNE, LUTHER H [[id number]]     4. Irregular periods Periods are irregular Will try micronor    5. Screening examination for STD (sexually transmitted disease) CV swab sent for GC/CHL, trich, BV and yeast   6. Negative pregnancy test UPT is negative      Plan:  Follow up in 3 months for ROS

## 2024-07-02 ENCOUNTER — Ambulatory Visit: Payer: Self-pay | Admitting: Adult Health

## 2024-07-02 LAB — CERVICOVAGINAL ANCILLARY ONLY
Bacterial Vaginitis (gardnerella): NEGATIVE
Candida Glabrata: NEGATIVE
Candida Vaginitis: POSITIVE — AB
Chlamydia: NEGATIVE
Comment: NEGATIVE
Comment: NEGATIVE
Comment: NEGATIVE
Comment: NEGATIVE
Comment: NEGATIVE
Comment: NORMAL
Neisseria Gonorrhea: NEGATIVE
Trichomonas: NEGATIVE

## 2024-07-02 NOTE — Telephone Encounter (Signed)
 Left message at 2:01 pm, letting pt know her results on vaginal swab and that Diflucan should cover yeast. JSY

## 2024-07-02 NOTE — Telephone Encounter (Signed)
-----   Message from Delon Lewis sent at 07/02/2024  1:37 PM EDT ----- Will you let her know about vaginal swab THX

## 2024-09-30 ENCOUNTER — Ambulatory Visit: Admitting: Adult Health
